# Patient Record
Sex: Male | Born: 1995 | Race: Black or African American | Hispanic: No | Marital: Single | Smoking: Never smoker
Health system: Southern US, Community
[De-identification: ages and names within clinical notes are randomized; demographics above are authoritative.]

## PROBLEM LIST (undated history)

## (undated) DIAGNOSIS — J45909 Unspecified asthma, uncomplicated: Secondary | ICD-10-CM

---
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
## 2014-08-18 ENCOUNTER — Emergency Department (HOSPITAL_COMMUNITY)
Admission: EM | Admit: 2014-08-18 | Discharge: 2014-08-18 | Disposition: A | Discharge status: Home / Self Care | Payer: Self-pay | Attending: Emergency Medicine | Admitting: Emergency Medicine

## 2014-08-18 ENCOUNTER — Encounter (HOSPITAL_COMMUNITY): Payer: Self-pay | Admitting: Nurse Practitioner

## 2014-08-18 DIAGNOSIS — Z79899 Other long term (current) drug therapy: Secondary | ICD-10-CM | POA: Insufficient documentation

## 2014-08-18 DIAGNOSIS — N342 Other urethritis: Secondary | ICD-10-CM

## 2014-08-18 DIAGNOSIS — N341 Nonspecific urethritis: Secondary | ICD-10-CM | POA: Insufficient documentation

## 2014-08-18 DIAGNOSIS — J45909 Unspecified asthma, uncomplicated: Secondary | ICD-10-CM | POA: Insufficient documentation

## 2014-08-18 DIAGNOSIS — Z7952 Long term (current) use of systemic steroids: Secondary | ICD-10-CM | POA: Insufficient documentation

## 2014-08-18 HISTORY — DX: Unspecified asthma, uncomplicated: J45.909

## 2014-08-18 MED ORDER — AZITHROMYCIN 250 MG PO TABS
1000.0000 mg | ORAL_TABLET | Freq: Once | ORAL | Status: AC
  Administered 2014-08-18: 1000 mg via ORAL
  Filled 2014-08-18: qty 4

## 2014-08-18 MED ORDER — LIDOCAINE HCL (PF) 1 % IJ SOLN
5.0000 mL | Freq: Once | INTRAMUSCULAR | Status: AC
  Administered 2014-08-18: 0.9 mL
  Filled 2014-08-18: qty 5

## 2014-08-18 MED ORDER — CEFTRIAXONE SODIUM 250 MG IJ SOLR
250.0000 mg | Freq: Once | INTRAMUSCULAR | Status: AC
  Administered 2014-08-18: 250 mg via INTRAMUSCULAR
  Filled 2014-08-18: qty 250

## 2014-08-18 NOTE — ED Provider Notes (Signed)
CSN: [id number]     Arrival date & time 08/18/14  1157 History  This chart was scribed for a non-physician practitioner, Cristian EmeryNicole Damion Kant, PA-C working with Benjiman CoreNathan Pickering, MD by [place] Peace, ED Scribe. The patient was seen in TR05C/TR05C. The patient's care was started at 12:47 PM.     Chief Complaint  Patient presents with  . SEXUALLY TRANSMITTED DISEASE      The history is provided by the patient. No language interpreter was used.  HPI Comments: Cristian Montgomery is a 19 y.o. male who presents to the Emergency Department complaining of possible STD exposure. Pt reports he recently had unprotected sex and has been experiencing dysuria and penile discharge. He states he thinks he may have gonorrhea. No complaints of fever, chills, or abdominal pain, rashes or lesions.   Past Medical History  Diagnosis Date  . Asthma    History reviewed. No pertinent past surgical history. History reviewed. No pertinent family history. History  Substance Use Topics  . Smoking status: Never Smoker   . Smokeless tobacco: Not on file  . Alcohol Use: No    Review of Systems  Constitutional: Negative for fever and chills.  Gastrointestinal: Negative for abdominal pain.  Genitourinary: Positive for dysuria and discharge.   A complete 10 system review of systems was obtained and all systems are negative except as noted in the HPI and PMH.     Allergies  Review of patient's allergies indicates no known allergies.  Home Medications   Prior to Admission medications   Medication Sig Start Date End Date Taking? Authorizing Provider  ketorolac (ACULAR) 0.5 % ophthalmic solution Place 1 drop into the right eye every 6 (six) hours. 02/23/13   Fayrene HelperBowie Tran, PA-C  prednisoLONE acetate (PRED FORTE) 1 % ophthalmic suspension Place 1 drop into the right eye 4 (four) times daily. 02/23/13   Fayrene HelperBowie Tran, PA-C   BP 117/67 mmHg  Pulse 65  Temp(Src) 98.1 F (36.7 C) (Oral)  Resp 16  Ht 6\' 1"  (1.854 m)  Wt 159 lb  (72.122 kg)  BMI 20.98 kg/m2  SpO2 99% Physical Exam  Constitutional: He is oriented to person, place, and time. He appears well-developed and well-nourished. No distress.  HENT:  Head: Normocephalic and atraumatic.  Eyes: Conjunctivae and EOM are normal.  Neck: Neck supple. No tracheal deviation present.  Cardiovascular: Normal rate.   Pulmonary/Chest: Effort normal. No respiratory distress.  Abdominal: Soft.  Genitourinary:  Genitourinary exam a chaperoned by nurse: No rashes or lesions, there is a thin white urethral discharge. No testicular pain and swelling.  Musculoskeletal: Normal range of motion.  Neurological: He is alert and oriented to person, place, and time.  Skin: Skin is warm and dry.  Psychiatric: He has a normal mood and affect. His behavior is normal.  Nursing note and vitals reviewed.   ED Course  Procedures (including critical care time) Labs Review Labs Reviewed  RPR  HIV ANTIBODY (ROUTINE TESTING)  GC/CHLAMYDIA PROBE AMP ([place])    Imaging Review No results found.   EKG Interpretation None     Medications - No data to display   12:50 PM- Treatment plan was discussed with patient who verbalizes understanding and agrees.   MDM   Final diagnoses:  Urethritis    Filed Vitals:   08/18/14 1223  BP: 117/67  Pulse: 65  Temp: 98.1 F (36.7 C)  TempSrc: Oral  Resp: 16  Height: 6\' 1"  (1.854 m)  Weight: 159 lb (72.122 kg)  SpO2:  99%    Medications  cefTRIAXone (ROCEPHIN) injection 250 mg (250 mg Intramuscular Given 08/18/14 1306)  azithromycin (ZITHROMAX) tablet 1,000 mg (1,000 mg Oral Given 08/18/14 1306)  lidocaine (PF) (XYLOCAINE) 1 % injection 5 mL (0.9 mLs Other Given 08/18/14 1307)    Cristian Montgomery is a pleasant 19 y.o. male presenting with referral discharge and dysuria. Patient had recent unprotected sex. STD panel is ordered.  I discussed findings and offered treatment for GC/Clamydia today in the ED. I explained to Pt that  results of GC/Chlamidia testing are pending and that they may come back negative. Discussed pros and cons of treatment. Pt opted for treatment today. Pt will be given  of rocephin IM and 1g Azithromycin PO.   Evaluation does not show pathology that would require ongoing emergent intervention or inpatient treatment. Pt is hemodynamically stable and mentating appropriately. Discussed findings and plan with patient/guardian, who agrees with care plan. All questions answered. Return precautions discussed and outpatient follow up given.    I personally performed the services described in this documentation, which was scribed in my presence. The recorded information has been reviewed and is accurate.    Cristian Emery, PA-C 08/18/14 1318  Benjiman Core, MD 08/18/14 [phone]

## 2014-08-18 NOTE — Discharge Instructions (Signed)
You were not tested for all STDs today. Your gonorrhea and chlamydia tests are pending- if they are positive, you will receive a phone call. Refrain from sex until you have the results from a full STD screen. Please go to [facility] (Address: [address], [place], [place] [address] Phone: [phone]) or see the [facility] (Address: [address] Phone: [phone]) for full STD screening. Return to the emergency room for worsening of symptoms, fever, and vomiting. ° °Please follow with your primary care doctor in the next 2 days for a check-up. They must obtain records for further management.  ° °Do not hesitate to return to the Emergency Department for any new, worsening or concerning symptoms.  ° ° °

## 2014-08-18 NOTE — ED Notes (Signed)
Declined W/C at D/C and was escorted to lobby by RN. 

## 2014-08-18 NOTE — ED Notes (Signed)
He wants to be checked for stds. Has had recent unprotected sex with dysuria since

## 2014-08-19 LAB — HIV ANTIBODY (ROUTINE TESTING W REFLEX): HIV SCREEN 4TH GENERATION: NONREACTIVE

## 2014-08-19 LAB — GC/CHLAMYDIA PROBE AMP (~~LOC~~) NOT AT ARMC
CHLAMYDIA, DNA PROBE: NEGATIVE
NEISSERIA GONORRHEA: POSITIVE — AB

## 2014-08-19 LAB — RPR: RPR Ser Ql: NONREACTIVE

## 2014-08-20 ENCOUNTER — Telehealth (HOSPITAL_BASED_OUTPATIENT_CLINIC_OR_DEPARTMENT_OTHER): Payer: Self-pay | Admitting: Emergency Medicine

## 2014-09-06 NOTE — ED Notes (Signed)
Pt. called [facility] with question about STD's.  Chart accessed and pt. verified x 2.  Pt. told he was pos. for GC on 3/13 and adequately treated with Rocephin and Zithromax.  He wants to know,what he should do now and if he needs to be tested again? Pt. denies any symptoms or unprotected sex since treatment. Pt. instructed no sex 1 week after treatment ( time has elapsed) and he should always practice safe sex. He should get rechecked if symptoms re-occur.  Pt. voiced understanding. 09/06/2014

## 2017-02-08 ENCOUNTER — Emergency Department (HOSPITAL_COMMUNITY)
Admission: EM | Admit: 2017-02-08 | Discharge: 2017-02-09 | Disposition: A | Discharge status: Home / Self Care | Payer: BLUE CROSS/BLUE SHIELD | Attending: Emergency Medicine | Admitting: Emergency Medicine

## 2017-02-08 ENCOUNTER — Encounter (HOSPITAL_COMMUNITY): Payer: Self-pay | Admitting: Emergency Medicine

## 2017-02-08 DIAGNOSIS — J45909 Unspecified asthma, uncomplicated: Secondary | ICD-10-CM | POA: Insufficient documentation

## 2017-02-08 DIAGNOSIS — Z79899 Other long term (current) drug therapy: Secondary | ICD-10-CM | POA: Insufficient documentation

## 2017-02-08 DIAGNOSIS — M79605 Pain in left leg: Secondary | ICD-10-CM | POA: Insufficient documentation

## 2017-02-08 MED ORDER — IBUPROFEN 800 MG PO TABS: 800.0000 mg | ORAL_TABLET | Freq: Three times a day (TID) | ORAL | 0 refills | Status: AC

## 2017-02-08 MED ORDER — IBUPROFEN 800 MG PO TABS
800.0000 mg | ORAL_TABLET | Freq: Once | ORAL | Status: AC
  Administered 2017-02-08: 800 mg via ORAL
  Filled 2017-02-08: qty 1
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
## 2017-02-08 NOTE — Discharge Instructions (Signed)
1. Medications: alternate ibuprofen and tylenol for pain control, usual home medications 2. Treatment: rest, ice, elevate and use ACE wrap and Crutches as needed, drink plenty of fluids, gentle stretching 3. Follow Up: Please followup with your PCP in 1 week if no improvement for discussion of your diagnoses and further evaluation after today's visit; if you do not have a primary care doctor use the resource guide provided to find one; Please return to the ER for worsening symptoms, numbness, weakness or other concerns
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
## 2017-06-10 ENCOUNTER — Encounter (HOSPITAL_COMMUNITY): Payer: Self-pay | Admitting: Emergency Medicine

## 2017-06-10 ENCOUNTER — Ambulatory Visit (HOSPITAL_COMMUNITY)
Admission: EM | Admit: 2017-06-10 | Discharge: 2017-06-10 | Disposition: A | Discharge status: Home / Self Care | Payer: BLUE CROSS/BLUE SHIELD | Attending: Physician Assistant | Admitting: Physician Assistant

## 2017-06-10 DIAGNOSIS — R369 Urethral discharge, unspecified: Secondary | ICD-10-CM | POA: Diagnosis not present

## 2017-06-10 DIAGNOSIS — Z202 Contact with and (suspected) exposure to infections with a predominantly sexual mode of transmission: Secondary | ICD-10-CM | POA: Diagnosis not present

## 2017-06-10 MED ORDER — AZITHROMYCIN 250 MG PO TABS
ORAL_TABLET | ORAL | Status: AC
  Filled 2017-06-10: qty 4

## 2017-06-10 MED ORDER — LIDOCAINE HCL (PF) 1 % IJ SOLN
INTRAMUSCULAR | Status: AC
  Filled 2017-06-10: qty 2

## 2017-06-10 MED ORDER — CEFTRIAXONE SODIUM 250 MG IJ SOLR
INTRAMUSCULAR | Status: AC
  Filled 2017-06-10: qty 250

## 2017-06-10 MED ORDER — CEFTRIAXONE SODIUM 250 MG IJ SOLR
250.0000 mg | Freq: Once | INTRAMUSCULAR | Status: AC
  Administered 2017-06-10: 250 mg via INTRAMUSCULAR

## 2017-06-10 MED ORDER — AZITHROMYCIN 250 MG PO TABS
1000.0000 mg | ORAL_TABLET | Freq: Once | ORAL | Status: AC
  Administered 2017-06-10: 1000 mg via ORAL

## 2017-06-10 NOTE — ED Triage Notes (Signed)
PT reports his partner has tested positive for gonorrhea and chlamydia,.

## 2017-06-10 NOTE — ED Provider Notes (Signed)
[facility]    CSN: [id number] Arrival date & time: 06/10/17  1550     History   Chief Complaint Chief Complaint  Patient presents with  . Exposure to STD    HPI Cristian Montgomery is a 22 y.o. male.   22 year old male comes in for STD exposure.  States his partner was positive for gonorrhea and chlamydia.  He has some penile discharge without lesions.  Denies fever, chills, night sweats.  Denies urinary symptoms such as dysuria, hematuria, frequency.  Denies abdominal pain, nausea, vomiting.  Denies testicular pain/swelling.  He is sexually active with one partner, no condom use.      Past Medical History:  Diagnosis Date  . Asthma     There are no active problems to display for this patient.   History reviewed. No pertinent surgical history.     Home Medications    Prior to Admission medications   Medication Sig Start Date End Date Taking? Authorizing Provider  ibuprofen (ADVIL,MOTRIN) 800 MG tablet Take 1 tablet (800 mg total) by mouth 3 (three) times daily. 02/08/17   Muthersbaugh, Dahlia ClientHannah, PA-C  ketorolac (ACULAR) 0.5 % ophthalmic solution Place 1 drop into the right eye every 6 (six) hours. 02/23/13   Fayrene Helperran, [place], PA-C  prednisoLONE acetate (PRED FORTE) 1 % ophthalmic suspension Place 1 drop into the right eye 4 (four) times daily. 02/23/13   Fayrene Helperran, [place], PA-C    Family History No family history on file.  Social History Social History   Tobacco Use  . Smoking status: Never Smoker  . Smokeless tobacco: Never Used  Substance Use Topics  . Alcohol use: No  . Drug use: No     Allergies   Shellfish allergy   Review of Systems Review of Systems  Reason unable to perform ROS: See HPI as above.     Physical Exam Triage Vital Signs ED Triage Vitals  Enc Vitals Group     BP 06/10/17 1618 122/69     Pulse Rate 06/10/17 1618 69     Resp 06/10/17 1618 16     Temp 06/10/17 1618 98.5 F (36.9 C)     Temp Source 06/10/17 1618 Oral     SpO2  06/10/17 1618 100 %     Weight 06/10/17 1617 150 lb (68 kg)     Height 06/10/17 1617 6' (1.829 m)     Head Circumference --      Peak Flow --      Pain Score 06/10/17 1617 0     Pain Loc --      Pain Edu? --      Excl. in GC? --    No data found.  Updated Vital Signs BP 122/69   Pulse 69   Temp 98.5 F (36.9 C) (Oral)   Resp 16   Ht 6' (1.829 m)   Wt 150 lb (68 kg)   SpO2 100%   BMI 20.34 kg/m   Visual Acuity Right Eye Distance:   Left Eye Distance:   Bilateral Distance:    Right Eye Near:   Left Eye Near:    Bilateral Near:     Physical Exam  Constitutional: He is oriented to person, place, and time. He appears well-developed and well-nourished. No distress.  HENT:  Head: Normocephalic and atraumatic.  Eyes: Conjunctivae are normal. Pupils are equal, round, and reactive to light.  Neurological: He is alert and oriented to person, place, and time.     UC Treatments /  Results  Labs (all labs ordered are listed, but only abnormal results are displayed) Labs Reviewed  URINE CYTOLOGY ANCILLARY ONLY    EKG  EKG Interpretation None       Radiology No results found.  Procedures Procedures (including critical care time)  Medications Ordered in UC Medications  azithromycin (ZITHROMAX) tablet 1,000 mg (1,000 mg Oral Given 06/10/17 1745)  cefTRIAXone (ROCEPHIN) injection 250 mg (250 mg Intramuscular Given 06/10/17 1745)     Initial Impression / Assessment and Plan / UC Course  I have reviewed the triage vital signs and the nursing notes.  Pertinent labs & imaging results that were available during my care of the patient were reviewed by me and considered in my medical decision making (see chart for details).    Patient was treated empirically for GC. Azithromycin and Rocephin given in office today. Cytology sent, patient will be contacted with any positive results that require additional treatment. Patient to refrain from sexual activity for the next 7  days. Return precautions given.    Final Clinical Impressions(s) / UC Diagnoses   Final diagnoses:  Exposure to STD    ED Discharge Orders    None       Lurline Idol 06/10/17 2156

## 2017-06-10 NOTE — Discharge Instructions (Signed)
You were treated empirically for gonorrhea, chlamydia. Azithromycin 1g by mouth and Rocephin 250mg  injection given in office today. Cytology sent, you will be contacted with any positive results that requires further treatment. Refrain from sexual activity for the next 7 days. Monitor for any worsening of symptoms, fever, abdominal pain, nausea, vomiting, testicular swelling/pain, penile lesion to follow up for reevaluation.

## 2017-06-13 LAB — URINE CYTOLOGY ANCILLARY ONLY
CHLAMYDIA, DNA PROBE: POSITIVE — AB
NEISSERIA GONORRHEA: POSITIVE — AB
Trichomonas: NEGATIVE

## 2021-12-28 ENCOUNTER — Emergency Department (HOSPITAL_BASED_OUTPATIENT_CLINIC_OR_DEPARTMENT_OTHER): Payer: Self-pay

## 2021-12-28 ENCOUNTER — Emergency Department
Admission: EM | Admit: 2021-12-28 | Discharge: 2021-12-28 | Disposition: A | Discharge status: Home / Self Care | Payer: Self-pay | Attending: Emergency Medicine | Admitting: Emergency Medicine

## 2021-12-28 DIAGNOSIS — R079 Chest pain, unspecified: Secondary | ICD-10-CM

## 2021-12-28 DIAGNOSIS — R0789 Other chest pain: Secondary | ICD-10-CM | POA: Insufficient documentation

## 2021-12-28 LAB — ECG 12-LEAD
ATRIAL RATE: 55 {beats}/min
P AXIS: 55 degrees
PR INTERVAL: 124 ms
QRS INTERVAL/DURATION: 98 ms
QT: 390 ms
QTc (Bazett): 373 ms
R AXIS: 60 degrees
T AXIS: 55 degrees
VENTRICULAR RATE: 55 {beats}/min

## 2021-12-28 NOTE — ED Notes (Signed)
Pt states understanding of all discharge instructions, no questions. Ambulated out of ED with steady gait. States has all belongings. Pt refused discharge set of vital signs. RR even and unlabored, maintains own airway, NAD noted.

## 2021-12-28 NOTE — ED Provider Notes (Signed)
ED Provider Note  [facility] electronic medical record reviewed for pertinent medical history.     Louis Shaw DOB: 01-22-1996 PMD: Judith Blonder     Chief Complaint   Patient presents with    Chest Pain - Adult     L sided CP x2 weeks        HPI: Louis Shaw is a 26 year old male w/ hx of asthma who presents with 2 weeks of left sided chest pain.    Pain over left chest 6-8/10. Comes and goes. Not associated with exertion. Lasts ~5-10 minutes. Not associated with diaphoresis, nausea, or vomiting. Does not radiate to back, jaw, or arms. Denies any trauma to the area or injuries. Plays basketball on a regular basis but last played ~1 month ago. Denies any SOB. Pain is not worse with certain positions or taking a deep breath. Came in b/c he told his girlfriend about the pain and she convinced him to come in. Has had 2 uncles with MI. No family hx of sudden cardiac death per his knowledge. No alcohol, smoking, drugs.    Pertinent Medical History:    PMHx: As above    No past surgical history on file.    No family history on file.    No current outpatient medications    Physical Exam  BP 112/61   Pulse 51   Temp 98.2 F (36.8 C)   Resp 8   Ht 6' (1.829 m)   Wt 74.8 kg (165 lb)   SpO2 95%   BMI 22.38 kg/m   Physical Exam  Vitals reviewed.   Constitutional:       General: He is not in acute distress.     Appearance: Normal appearance.   Eyes:      General: No scleral icterus.     Extraocular Movements: Extraocular movements intact.   Cardiovascular:      Rate and Rhythm: Normal rate and regular rhythm.      Heart sounds: Normal heart sounds. No murmur heard.     No friction rub. No gallop.   Pulmonary:      Effort: Pulmonary effort is normal.      Breath sounds: Normal breath sounds. No wheezing, rhonchi or rales.   Abdominal:      General: Abdomen is flat. Bowel sounds are normal. There is no distension.      Palpations: Abdomen is soft. There is no mass.      Tenderness: There is no abdominal tenderness.  There is no guarding.   Musculoskeletal:      Right lower leg: No edema.      Left lower leg: No edema.      Comments: No tenderness on palpation of chest. Slightly TTP to over L scapula and paraspinal muscles.    Skin:     General: Skin is warm and dry.      Coloration: Skin is not jaundiced or pale.   Neurological:      General: No focal deficit present.      Mental Status: He is alert.   Psychiatric:         Mood and Affect: Mood normal.         Behavior: Behavior normal.         Thought Content: Thought content normal.         Judgment: Judgment normal.         Orders/Medications    Orders Placed This Encounter   Procedures  X-Ray Chest Frontal And Lateral    [facility] (No Assigned PCP/Clinic)    ECG 12 Lead       Medications - No data to display    Medical Decision Making/Assessment/Plan    This is a(n) 26 year old male w/ Pmh of asthma who presents with 2 weeks of chest pain. Non-exertional. No associated symptoms and does not radiate. Not positional or worse w/ inspiration. No recent trauma or injuries that he can recall. EKG w/o ST changes and chest xray w.o consolidation or pneumothorax. Patient referred to PCP to follow up and given ED return precautions. Pain is likely musculoskeletal. He was told he can use tylenol for the pain.    Staffed with attending physician, Dr. Elenor Legato, PGY2  [facility] - Internal Medicine     ED Course/Updates/Disposition  ED Course as of 12/28/21 0731   [facility] Documentation   Mon Dec 28, 2021   9381 42M w/ hx of asthma presenting with left sided chest pain for 2 weeks. Comes and goes 6/10, sharp pain. Not associated w/ exercise or deep breaths. Goes away in 5 min.    0243 ECG 12 Lead       ED Diagnoses:  1. Non-cardiac chest pain  [facility] (No Assigned PCP/Clinic)                  Chesky Heyer, Lovenia Shuck, MD  Resident  12/28/21 0175       Roselee Nova, MD  01/02/22 1025

## 2021-12-28 NOTE — ED Notes (Signed)
Patient here today c/o mild intermittent L sided chest pressure x 2 weeks. Describes as dull, throbbing pressure. Denies associated sob, n/v, cough or fevers.     No past medical history on file.

## 2021-12-28 NOTE — Discharge Instructions (Addendum)
You came in with chest pain. Your heart and lungs look okay. The pain is probably from your muscles. You have been referred to a primary care doctor who you should schedule an appointment with. They can follow this pain over time and make sur it resolves. You can take tylenol for the pain.

## 2022-05-06 ENCOUNTER — Emergency Department
Admission: EM | Admit: 2022-05-06 | Discharge: 2022-05-06 | Disposition: A | Discharge status: Home / Self Care | Payer: Self-pay | Attending: Student in an Organized Health Care Education/Training Program | Admitting: Student in an Organized Health Care Education/Training Program

## 2022-05-06 DIAGNOSIS — F17290 Nicotine dependence, other tobacco product, uncomplicated: Secondary | ICD-10-CM | POA: Insufficient documentation

## 2022-05-06 DIAGNOSIS — R069 Unspecified abnormalities of breathing: Secondary | ICD-10-CM

## 2022-05-06 DIAGNOSIS — R0602 Shortness of breath: Secondary | ICD-10-CM | POA: Insufficient documentation

## 2022-05-06 DIAGNOSIS — J45909 Unspecified asthma, uncomplicated: Secondary | ICD-10-CM | POA: Insufficient documentation

## 2022-05-06 NOTE — Discharge Instructions (Signed)
You were seen in the emergency room for breathing problems. The exam we did here showed no acute pathology of your lungs. A referral was placed for primary care, please call them and schedule an appointment.  We recommend refraining from vaping and smoking at this time.  Please return to the emergency room if you symptoms worsen or persist, if you develop a fever, or intractable pain.

## 2022-05-06 NOTE — ED Notes (Signed)
AVS copy provided with referral. Patient verbalized understanding and ambulated to exit with steady gait.

## 2022-05-06 NOTE — ED Provider Notes (Signed)
ED Provider Note  [facility] electronic medical record reviewed for pertinent medical history.     Louis Shaw DOB: [birth year] PMD: Judith Blonder     Chief Complaint   Patient presents with    Shortness of Breath     States that he has had 6 months of abnormal breathing, he states that he vapes and smokes.        HPI: Louis Shaw is a 26 year old male who has no past medical history on file.  Presents to the emergency room with what he describes as left-sided fluttering his chest.  He denies any chest pain does not have any trouble breathing.  He is a hard time describing what it feels like however feels off to him.  Endorses that he smokes and vapes daily.  Smokes marijuana.  These symptoms have been going on for some time he states that he was just wanted someone to listen to his lungs to make sure everything looks good.  Even if anything is found he states that he would rather get it worked up by an outpatient primary care doctor rather than getting worked up in the emergency room.  Denies any fevers chills, runny nose, nausea vomiting, leg swelling, urinary symptoms, constipation diarrhea.    He states he is a past medical history of asthma but has not ever used any medications and grew out of it.          External Data Sources (Select all that apply):  recent discharge summary from (institution & date) [facility] Emergency room 12/28/21. Information obtained:  Seen for nonspecific chest pain workup negative.    Pertinent Medical History:    PMHx: As above    Patient endorses vaping and marijuana use  No past surgical history on file.    No family history on file.    No current outpatient medications    Physical Exam  BP 130/79   Pulse 78   Temp 98.1 F (36.7 C)   Resp 16   Ht 6' (1.829 m)   Wt 74.8 kg (165 lb)   SpO2 99%   BMI 22.38 kg/m   Physical Exam  Vitals reviewed.   Constitutional:       Appearance: Normal appearance.   HENT:      Head: Normocephalic and atraumatic.      Nose: Nose normal.       Mouth/Throat:      Mouth: Mucous membranes are moist.      Pharynx: Oropharynx is clear.   Eyes:      Extraocular Movements: Extraocular movements intact.      Pupils: Pupils are equal, round, and reactive to light.   Cardiovascular:      Rate and Rhythm: Normal rate and regular rhythm.      Pulses: Normal pulses.      Heart sounds: Normal heart sounds.   Pulmonary:      Effort: Pulmonary effort is normal.      Breath sounds: Normal breath sounds. No wheezing, rhonchi or rales.   Chest:      Chest wall: No tenderness.   Abdominal:      General: Abdomen is flat. Bowel sounds are normal.      Palpations: Abdomen is soft.   Musculoskeletal:         General: Normal range of motion.      Cervical back: Normal range of motion.   Skin:     General: Skin is warm.  Capillary Refill: Capillary refill takes less than 2 seconds.   Neurological:      General: No focal deficit present.      Mental Status: He is alert and oriented to person, place, and time.           Orders/Medications    Orders Placed This Encounter   Procedures    Internal Medicine Clinic    ECG 12 Lead       Medications - No data to display    Medical Decision Making/Assessment/Plan    This is a(n) 26 year old male who has no past medical history on file. and presents with shortness of breath and what he describes as a chest fluttering on the left side likely benign cause.  Exam not notable for any concerning lung sounds or heart sounds.  Considered further workup and imaging however given patient no notable past medical history or other concerning symptoms deferred at this time.  Patient states that he would like to be connected with a primary care doctor and even if something was abnormal here in the emergency room that he would prefer to be worked up outpatient.  We reassured the patient provided him with a referral to primary care and reassured him that his symptoms were less concerning for an acute cardiac pathology at this time.  Uses the vape  and marijuana we recommended cessation from this as these could be contributing to his current symptoms at this time.      ED Course/Updates/Disposition  ED Course as of 05/06/22 1900   Rita Ohara [facility]'s Documentation   Thu May 06, 2022   1830 ECG 12 Lead  Normal sinus rhythm rate 69 normal axis, normal intervals, no ischemic changes.  Appears similar to July 2023 EKG.      Others' Documentation   Thu May 06, 2022   1829 EKG my interpretation: No STEMI   [id number] , nrml axis/intvl abnormal EKG   [LO]      ED Course User Index  [LO] Christell Constant, MD         ED Clinical Impression as of 05/06/22 1900   Breathing problem                      Data Reviewed:        Risk of Complications and/or Morbidity:               Abram Sander, MD  Resident  05/06/22 2340       Moss Mc, MD  05/06/22 2354

## 2022-05-08 LAB — ECG 12-LEAD
ATRIAL RATE: 69 {beats}/min
ECG INTERPRETATION: NORMAL
P AXIS: 58 degrees
PR INTERVAL: 128 ms
QRS INTERVAL/DURATION: 96 ms
QT: 366 ms
QTc (Bazett): 392 ms
R AXIS: 65 degrees
T AXIS: 53 degrees
VENTRICULAR RATE: 69 {beats}/min

## 2023-07-16 ENCOUNTER — Emergency Department (HOSPITAL_BASED_OUTPATIENT_CLINIC_OR_DEPARTMENT_OTHER): Payer: BC Managed Care – PPO

## 2023-07-16 ENCOUNTER — Emergency Department (HOSPITAL_BASED_OUTPATIENT_CLINIC_OR_DEPARTMENT_OTHER)
Admission: EM | Admit: 2023-07-16 | Discharge: 2023-07-16 | Disposition: A | Discharge status: Home / Self Care | Payer: BC Managed Care – PPO | Attending: Emergency Medicine | Admitting: Emergency Medicine

## 2023-07-16 ENCOUNTER — Encounter (HOSPITAL_BASED_OUTPATIENT_CLINIC_OR_DEPARTMENT_OTHER): Payer: Self-pay | Admitting: *Deleted

## 2023-07-16 ENCOUNTER — Other Ambulatory Visit: Payer: Self-pay

## 2023-07-16 DIAGNOSIS — S1190XA Unspecified open wound of unspecified part of neck, initial encounter: Secondary | ICD-10-CM | POA: Insufficient documentation

## 2023-07-16 DIAGNOSIS — Z23 Encounter for immunization: Secondary | ICD-10-CM | POA: Insufficient documentation

## 2023-07-16 DIAGNOSIS — R200 Anesthesia of skin: Secondary | ICD-10-CM

## 2023-07-16 DIAGNOSIS — T148XXA Other injury of unspecified body region, initial encounter: Secondary | ICD-10-CM

## 2023-07-16 DIAGNOSIS — S51801A Unspecified open wound of right forearm, initial encounter: Secondary | ICD-10-CM | POA: Diagnosis not present

## 2023-07-16 DIAGNOSIS — W260XXA Contact with knife, initial encounter: Secondary | ICD-10-CM | POA: Diagnosis not present

## 2023-07-16 LAB — I-STAT CHEM 8, ED
BUN: 12 mg/dL (ref 6–20)
Calcium, Ion: 1.08 mmol/L — ABNORMAL LOW (ref 1.15–1.40)
Chloride: 108 mmol/L (ref 98–111)
Creatinine, Ser: 1.2 mg/dL (ref 0.61–1.24)
Glucose, Bld: 99 mg/dL (ref 70–99)
HCT: 45 % (ref 39.0–52.0)
Hemoglobin: 15.3 g/dL (ref 13.0–17.0)
Potassium: 3.4 mmol/L — ABNORMAL LOW (ref 3.5–5.1)
Sodium: 141 mmol/L (ref 135–145)
TCO2: 17 mmol/L — ABNORMAL LOW (ref 22–32)

## 2023-07-16 LAB — COMPREHENSIVE METABOLIC PANEL
ALT: 17 U/L (ref 0–44)
AST: 27 U/L (ref 15–41)
Albumin: 4 g/dL (ref 3.5–5.0)
Alkaline Phosphatase: 43 U/L (ref 38–126)
Anion gap: 14 (ref 5–15)
BUN: 12 mg/dL (ref 6–20)
CO2: 18 mmol/L — ABNORMAL LOW (ref 22–32)
Calcium: 8.9 mg/dL (ref 8.9–10.3)
Chloride: 106 mmol/L (ref 98–111)
Creatinine, Ser: 1.23 mg/dL (ref 0.61–1.24)
GFR, Estimated: >60 mL/min (ref >60)
Glucose, Bld: 106 mg/dL — ABNORMAL HIGH (ref 70–99)
Potassium: 3.2 mmol/L — ABNORMAL LOW (ref 3.5–5.1)
Sodium: 138 mmol/L (ref 135–145)
Total Bilirubin: 0.8 mg/dL (ref 0.0–1.2)
Total Protein: 7.4 g/dL (ref 6.5–8.1)

## 2023-07-16 LAB — CBC
HCT: 44.6 % (ref 39.0–52.0)
Hemoglobin: 14.5 g/dL (ref 13.0–17.0)
MCH: 30.1 pg (ref 26.0–34.0)
MCHC: 32.5 g/dL (ref 30.0–36.0)
MCV: 92.7 fL (ref 80.0–100.0)
Platelets: 174 10*3/uL (ref 150–400)
RBC: 4.81 MIL/uL (ref 4.22–5.81)
RDW: 11.9 % (ref 11.5–15.5)
WBC: 6.3 10*3/uL (ref 4.0–10.5)
nRBC: 0 % (ref 0.0–0.2)

## 2023-07-16 LAB — PROTIME-INR
INR: 1 (ref 0.8–1.2)
Prothrombin Time: 13.7 s (ref 11.4–15.2)

## 2023-07-16 MED ORDER — IOHEXOL 350 MG/ML SOLN
75.0000 mL | Freq: Once | INTRAVENOUS | Status: AC | PRN
  Administered 2023-07-16: 100 mL via INTRAVENOUS

## 2023-07-16 MED ORDER — CEFAZOLIN SODIUM-DEXTROSE 1-4 GM/50ML-% IV SOLN
1.0000 g | Freq: Once | INTRAVENOUS | Status: AC
  Administered 2023-07-16: 1 g via INTRAVENOUS
  Filled 2023-07-16: qty 50

## 2023-07-16 MED ORDER — TETANUS-DIPHTH-ACELL PERTUSSIS 5-2.5-18.5 LF-MCG/0.5 IM SUSY
0.5000 mL | PREFILLED_SYRINGE | Freq: Once | INTRAMUSCULAR | Status: AC
  Administered 2023-07-16: 0.5 mL via INTRAMUSCULAR
  Filled 2023-07-16: qty 0.5

## 2023-07-16 MED ORDER — CEFAZOLIN SODIUM 1 G IJ SOLR
INTRAMUSCULAR | Status: AC
  Filled 2023-07-16: qty 10

## 2023-07-16 MED ORDER — IOHEXOL 300 MG/ML  SOLN
100.0000 mL | Freq: Once | INTRAMUSCULAR | Status: AC | PRN
  Administered 2023-07-16: 100 mL via INTRAVENOUS

## 2023-07-16 MED ORDER — LIDOCAINE-EPINEPHRINE 1 %-1:100000 IJ SOLN
10.0000 mL | Freq: Once | INTRAMUSCULAR | Status: AC
  Administered 2023-07-16: 10 mL
  Filled 2023-07-16: qty 1

## 2023-07-16 MED ORDER — LACTATED RINGERS IV BOLUS
2000.0000 mL | Freq: Once | INTRAVENOUS | Status: AC
  Administered 2023-07-16: 2000 mL via INTRAVENOUS
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
## 2023-07-16 NOTE — ED Triage Notes (Signed)
 Pt was taken to CT with staff.  BP improving
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
## 2023-07-16 NOTE — ED Notes (Signed)
 Pt arrived from [facility].

## 2023-07-16 NOTE — ED Notes (Signed)
 Pt was picked up by [facility] directly from CT and transported to [facility] ED.
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
## 2023-07-16 NOTE — ED Notes (Addendum)
 Keys given to visitor that brought him in.  Belongings given to officer [place]ona [place] [facility] [employer]

## 2023-07-16 NOTE — Progress Notes (Signed)
 Orthopedic Tech Progress Note Patient Details:  Cristian Montgomery [birth year] [id number] Level 1 Trauma  Patient ID: Cristian Montgomery, male   DOB: 28-Feb-1996, 28 y.o.   MRN: [id number]  Phill Brazil 07/16/2023, 5:28 PM

## 2023-07-16 NOTE — ED Notes (Signed)
 50mcg fentanyl  given by [facility] at 1648.
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
## 2023-07-16 NOTE — ED Notes (Signed)
 XR at bedside. EDMD at bedside x2

## 2023-07-16 NOTE — ED Notes (Signed)
 Called [facility] for [facility] level 1 Transport to [facility] ED
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
## 2023-07-18 LAB — BPAM RBC
Blood Product Expiration Date: 202502192359
Blood Product Expiration Date: 202502202359
ISSUE DATE / TIME: 202502081530
ISSUE DATE / TIME: 202502081530
Unit Type and Rh: 9500
Unit Type and Rh: 9500

## 2023-07-18 LAB — TYPE AND SCREEN
Unit division: 0
Unit division: 0

## 2023-07-20 MED FILL — Fentanyl Citrate Preservative Free (PF) Inj 100 MCG/2ML: INTRAMUSCULAR | Qty: 2 | Status: AC

## 2023-08-02 ENCOUNTER — Other Ambulatory Visit: Payer: Self-pay

## 2023-08-02 ENCOUNTER — Emergency Department (HOSPITAL_COMMUNITY)
Admission: EM | Admit: 2023-08-02 | Discharge: 2023-08-02 | Discharge status: Against Medical Advice | Payer: BC Managed Care – PPO | Attending: Emergency Medicine | Admitting: Emergency Medicine

## 2023-08-02 DIAGNOSIS — Z4802 Encounter for removal of sutures: Secondary | ICD-10-CM | POA: Insufficient documentation

## 2023-08-02 DIAGNOSIS — Z5321 Procedure and treatment not carried out due to patient leaving prior to being seen by health care provider: Secondary | ICD-10-CM | POA: Diagnosis not present
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
## 2023-08-03 ENCOUNTER — Other Ambulatory Visit: Payer: Self-pay

## 2023-08-03 ENCOUNTER — Emergency Department (HOSPITAL_COMMUNITY)
Admission: EM | Admit: 2023-08-03 | Discharge: 2023-08-03 | Discharge status: Against Medical Advice | Payer: BC Managed Care – PPO | Attending: Emergency Medicine | Admitting: Emergency Medicine

## 2023-08-03 ENCOUNTER — Encounter (HOSPITAL_COMMUNITY): Payer: Self-pay

## 2023-08-03 DIAGNOSIS — Z5321 Procedure and treatment not carried out due to patient leaving prior to being seen by health care provider: Secondary | ICD-10-CM | POA: Diagnosis not present

## 2023-08-03 DIAGNOSIS — Z4802 Encounter for removal of sutures: Secondary | ICD-10-CM | POA: Diagnosis present

## 2023-08-03 NOTE — ED Notes (Signed)
 Pt decided to leave stated he will try somewhere to have stitches removed

## 2023-08-03 NOTE — ED Triage Notes (Signed)
 Pt here to get stiches removed and thinks he has nerve damage to right hand. Pt able to move right hand and states when he wakes up there is a "flash of numbness". AXOX4.

## 2023-08-06 ENCOUNTER — Telehealth: Payer: Self-pay
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
## 2023-08-06 NOTE — Telephone Encounter (Signed)
 Dr Metger responded to have him call the [facility] office on Monday to  get an appointment. Called patient with this information
